# Patient Record
Sex: Male | Born: 1945 | Race: White | Hispanic: No | Marital: Married | Smoking: Former smoker
Health system: Southern US, Community
[De-identification: ages and names within clinical notes are randomized; demographics above are authoritative.]

## PROBLEM LIST (undated history)

## (undated) DIAGNOSIS — E079 Disorder of thyroid, unspecified: Secondary | ICD-10-CM

## (undated) DIAGNOSIS — E119 Type 2 diabetes mellitus without complications: Secondary | ICD-10-CM

## (undated) HISTORY — PX: WRIST SURGERY: SHX841

---

## 2014-11-08 DIAGNOSIS — I10 Essential (primary) hypertension: Secondary | ICD-10-CM | POA: Diagnosis not present

## 2014-11-08 DIAGNOSIS — E785 Hyperlipidemia, unspecified: Secondary | ICD-10-CM | POA: Diagnosis not present

## 2014-11-08 DIAGNOSIS — G4733 Obstructive sleep apnea (adult) (pediatric): Secondary | ICD-10-CM | POA: Diagnosis not present

## 2014-11-08 DIAGNOSIS — E039 Hypothyroidism, unspecified: Secondary | ICD-10-CM | POA: Diagnosis not present

## 2014-12-22 DIAGNOSIS — E119 Type 2 diabetes mellitus without complications: Secondary | ICD-10-CM | POA: Diagnosis not present

## 2014-12-22 DIAGNOSIS — E039 Hypothyroidism, unspecified: Secondary | ICD-10-CM | POA: Diagnosis not present

## 2014-12-28 DIAGNOSIS — E119 Type 2 diabetes mellitus without complications: Secondary | ICD-10-CM | POA: Diagnosis not present

## 2014-12-28 DIAGNOSIS — E039 Hypothyroidism, unspecified: Secondary | ICD-10-CM | POA: Diagnosis not present

## 2014-12-28 DIAGNOSIS — E785 Hyperlipidemia, unspecified: Secondary | ICD-10-CM | POA: Diagnosis not present

## 2014-12-28 DIAGNOSIS — I10 Essential (primary) hypertension: Secondary | ICD-10-CM | POA: Diagnosis not present

## 2015-02-09 DIAGNOSIS — E291 Testicular hypofunction: Secondary | ICD-10-CM | POA: Diagnosis not present

## 2015-02-09 DIAGNOSIS — E559 Vitamin D deficiency, unspecified: Secondary | ICD-10-CM | POA: Diagnosis not present

## 2015-02-09 DIAGNOSIS — I10 Essential (primary) hypertension: Secondary | ICD-10-CM | POA: Diagnosis not present

## 2015-02-09 DIAGNOSIS — F325 Major depressive disorder, single episode, in full remission: Secondary | ICD-10-CM | POA: Diagnosis not present

## 2015-02-09 DIAGNOSIS — N529 Male erectile dysfunction, unspecified: Secondary | ICD-10-CM | POA: Diagnosis not present

## 2015-02-09 DIAGNOSIS — Z720 Tobacco use: Secondary | ICD-10-CM | POA: Diagnosis not present

## 2015-02-09 DIAGNOSIS — M79601 Pain in right arm: Secondary | ICD-10-CM | POA: Diagnosis not present

## 2015-02-09 DIAGNOSIS — E039 Hypothyroidism, unspecified: Secondary | ICD-10-CM | POA: Diagnosis not present

## 2015-02-09 DIAGNOSIS — K219 Gastro-esophageal reflux disease without esophagitis: Secondary | ICD-10-CM | POA: Diagnosis not present

## 2015-02-09 DIAGNOSIS — G4733 Obstructive sleep apnea (adult) (pediatric): Secondary | ICD-10-CM | POA: Diagnosis not present

## 2015-02-09 DIAGNOSIS — E119 Type 2 diabetes mellitus without complications: Secondary | ICD-10-CM | POA: Diagnosis not present

## 2015-02-22 DIAGNOSIS — E119 Type 2 diabetes mellitus without complications: Secondary | ICD-10-CM | POA: Diagnosis not present

## 2015-02-22 DIAGNOSIS — E785 Hyperlipidemia, unspecified: Secondary | ICD-10-CM | POA: Diagnosis not present

## 2015-02-22 DIAGNOSIS — E559 Vitamin D deficiency, unspecified: Secondary | ICD-10-CM | POA: Diagnosis not present

## 2015-02-22 DIAGNOSIS — E039 Hypothyroidism, unspecified: Secondary | ICD-10-CM | POA: Diagnosis not present

## 2015-05-17 DIAGNOSIS — I10 Essential (primary) hypertension: Secondary | ICD-10-CM | POA: Diagnosis not present

## 2015-05-17 DIAGNOSIS — F325 Major depressive disorder, single episode, in full remission: Secondary | ICD-10-CM | POA: Diagnosis not present

## 2015-05-17 DIAGNOSIS — E785 Hyperlipidemia, unspecified: Secondary | ICD-10-CM | POA: Diagnosis not present

## 2015-05-17 DIAGNOSIS — G4733 Obstructive sleep apnea (adult) (pediatric): Secondary | ICD-10-CM | POA: Diagnosis not present

## 2015-05-17 DIAGNOSIS — Z23 Encounter for immunization: Secondary | ICD-10-CM | POA: Diagnosis not present

## 2015-05-17 DIAGNOSIS — E119 Type 2 diabetes mellitus without complications: Secondary | ICD-10-CM | POA: Diagnosis not present

## 2015-08-22 DIAGNOSIS — E119 Type 2 diabetes mellitus without complications: Secondary | ICD-10-CM | POA: Diagnosis not present

## 2015-08-23 DIAGNOSIS — M7582 Other shoulder lesions, left shoulder: Secondary | ICD-10-CM | POA: Diagnosis not present

## 2015-08-23 DIAGNOSIS — S30861D Insect bite (nonvenomous) of abdominal wall, subsequent encounter: Secondary | ICD-10-CM | POA: Diagnosis not present

## 2015-08-23 DIAGNOSIS — E119 Type 2 diabetes mellitus without complications: Secondary | ICD-10-CM | POA: Diagnosis not present

## 2015-08-23 DIAGNOSIS — Z23 Encounter for immunization: Secondary | ICD-10-CM | POA: Diagnosis not present

## 2015-11-30 DIAGNOSIS — E119 Type 2 diabetes mellitus without complications: Secondary | ICD-10-CM | POA: Diagnosis not present

## 2015-11-30 DIAGNOSIS — Z8249 Family history of ischemic heart disease and other diseases of the circulatory system: Secondary | ICD-10-CM | POA: Diagnosis not present

## 2015-11-30 DIAGNOSIS — E782 Mixed hyperlipidemia: Secondary | ICD-10-CM | POA: Diagnosis not present

## 2015-11-30 DIAGNOSIS — G4733 Obstructive sleep apnea (adult) (pediatric): Secondary | ICD-10-CM | POA: Diagnosis not present

## 2015-11-30 DIAGNOSIS — K219 Gastro-esophageal reflux disease without esophagitis: Secondary | ICD-10-CM | POA: Diagnosis not present

## 2015-11-30 DIAGNOSIS — N529 Male erectile dysfunction, unspecified: Secondary | ICD-10-CM | POA: Diagnosis not present

## 2015-11-30 DIAGNOSIS — E039 Hypothyroidism, unspecified: Secondary | ICD-10-CM | POA: Diagnosis not present

## 2015-12-28 DIAGNOSIS — E119 Type 2 diabetes mellitus without complications: Secondary | ICD-10-CM | POA: Diagnosis not present

## 2016-02-27 DIAGNOSIS — E119 Type 2 diabetes mellitus without complications: Secondary | ICD-10-CM | POA: Diagnosis not present

## 2016-02-27 DIAGNOSIS — K219 Gastro-esophageal reflux disease without esophagitis: Secondary | ICD-10-CM | POA: Diagnosis not present

## 2016-02-27 DIAGNOSIS — E559 Vitamin D deficiency, unspecified: Secondary | ICD-10-CM | POA: Diagnosis not present

## 2016-02-27 DIAGNOSIS — G4733 Obstructive sleep apnea (adult) (pediatric): Secondary | ICD-10-CM | POA: Diagnosis not present

## 2016-02-27 DIAGNOSIS — E782 Mixed hyperlipidemia: Secondary | ICD-10-CM | POA: Diagnosis not present

## 2016-02-27 DIAGNOSIS — N529 Male erectile dysfunction, unspecified: Secondary | ICD-10-CM | POA: Diagnosis not present

## 2016-02-27 DIAGNOSIS — E039 Hypothyroidism, unspecified: Secondary | ICD-10-CM | POA: Diagnosis not present

## 2016-03-01 DIAGNOSIS — E119 Type 2 diabetes mellitus without complications: Secondary | ICD-10-CM | POA: Diagnosis not present

## 2016-03-01 DIAGNOSIS — E559 Vitamin D deficiency, unspecified: Secondary | ICD-10-CM | POA: Diagnosis not present

## 2016-03-01 DIAGNOSIS — E782 Mixed hyperlipidemia: Secondary | ICD-10-CM | POA: Diagnosis not present

## 2016-03-01 DIAGNOSIS — G4733 Obstructive sleep apnea (adult) (pediatric): Secondary | ICD-10-CM | POA: Diagnosis not present

## 2016-03-01 DIAGNOSIS — K219 Gastro-esophageal reflux disease without esophagitis: Secondary | ICD-10-CM | POA: Diagnosis not present

## 2016-03-01 DIAGNOSIS — E039 Hypothyroidism, unspecified: Secondary | ICD-10-CM | POA: Diagnosis not present

## 2016-08-26 DIAGNOSIS — E119 Type 2 diabetes mellitus without complications: Secondary | ICD-10-CM | POA: Diagnosis not present

## 2016-08-26 DIAGNOSIS — E039 Hypothyroidism, unspecified: Secondary | ICD-10-CM | POA: Diagnosis not present

## 2016-08-26 DIAGNOSIS — K219 Gastro-esophageal reflux disease without esophagitis: Secondary | ICD-10-CM | POA: Diagnosis not present

## 2016-08-26 DIAGNOSIS — E782 Mixed hyperlipidemia: Secondary | ICD-10-CM | POA: Diagnosis not present

## 2016-08-26 DIAGNOSIS — E559 Vitamin D deficiency, unspecified: Secondary | ICD-10-CM | POA: Diagnosis not present

## 2016-08-29 DIAGNOSIS — E119 Type 2 diabetes mellitus without complications: Secondary | ICD-10-CM | POA: Diagnosis not present

## 2016-08-29 DIAGNOSIS — Z6829 Body mass index (BMI) 29.0-29.9, adult: Secondary | ICD-10-CM | POA: Diagnosis not present

## 2016-08-29 DIAGNOSIS — E039 Hypothyroidism, unspecified: Secondary | ICD-10-CM | POA: Diagnosis not present

## 2016-08-29 DIAGNOSIS — Z8249 Family history of ischemic heart disease and other diseases of the circulatory system: Secondary | ICD-10-CM | POA: Diagnosis not present

## 2016-08-29 DIAGNOSIS — Z23 Encounter for immunization: Secondary | ICD-10-CM | POA: Diagnosis not present

## 2016-08-29 DIAGNOSIS — E782 Mixed hyperlipidemia: Secondary | ICD-10-CM | POA: Diagnosis not present

## 2016-08-29 DIAGNOSIS — K219 Gastro-esophageal reflux disease without esophagitis: Secondary | ICD-10-CM | POA: Diagnosis not present

## 2017-03-06 DIAGNOSIS — E782 Mixed hyperlipidemia: Secondary | ICD-10-CM | POA: Diagnosis not present

## 2017-03-06 DIAGNOSIS — E119 Type 2 diabetes mellitus without complications: Secondary | ICD-10-CM | POA: Diagnosis not present

## 2017-03-06 DIAGNOSIS — E039 Hypothyroidism, unspecified: Secondary | ICD-10-CM | POA: Diagnosis not present

## 2017-03-06 DIAGNOSIS — K219 Gastro-esophageal reflux disease without esophagitis: Secondary | ICD-10-CM | POA: Diagnosis not present

## 2017-03-11 DIAGNOSIS — E119 Type 2 diabetes mellitus without complications: Secondary | ICD-10-CM | POA: Diagnosis not present

## 2017-03-11 DIAGNOSIS — E782 Mixed hyperlipidemia: Secondary | ICD-10-CM | POA: Diagnosis not present

## 2017-03-11 DIAGNOSIS — Z1389 Encounter for screening for other disorder: Secondary | ICD-10-CM | POA: Diagnosis not present

## 2017-03-11 DIAGNOSIS — Z6828 Body mass index (BMI) 28.0-28.9, adult: Secondary | ICD-10-CM | POA: Diagnosis not present

## 2017-03-11 DIAGNOSIS — K219 Gastro-esophageal reflux disease without esophagitis: Secondary | ICD-10-CM | POA: Diagnosis not present

## 2017-03-11 DIAGNOSIS — E039 Hypothyroidism, unspecified: Secondary | ICD-10-CM | POA: Diagnosis not present

## 2017-03-11 DIAGNOSIS — Z0001 Encounter for general adult medical examination with abnormal findings: Secondary | ICD-10-CM | POA: Diagnosis not present

## 2017-03-11 DIAGNOSIS — N529 Male erectile dysfunction, unspecified: Secondary | ICD-10-CM | POA: Diagnosis not present

## 2018-01-02 ENCOUNTER — Emergency Department (HOSPITAL_COMMUNITY): Payer: Medicare Other

## 2018-01-02 ENCOUNTER — Other Ambulatory Visit: Payer: Self-pay

## 2018-01-02 ENCOUNTER — Encounter (HOSPITAL_COMMUNITY): Payer: Self-pay | Admitting: Emergency Medicine

## 2018-01-02 ENCOUNTER — Emergency Department (HOSPITAL_COMMUNITY)
Admission: EM | Admit: 2018-01-02 | Discharge: 2018-01-02 | Disposition: A | Discharge status: Home / Self Care | Payer: Medicare Other | Attending: Emergency Medicine | Admitting: Emergency Medicine

## 2018-01-02 DIAGNOSIS — M542 Cervicalgia: Secondary | ICD-10-CM | POA: Insufficient documentation

## 2018-01-02 DIAGNOSIS — Z7982 Long term (current) use of aspirin: Secondary | ICD-10-CM | POA: Diagnosis not present

## 2018-01-02 DIAGNOSIS — R079 Chest pain, unspecified: Secondary | ICD-10-CM | POA: Diagnosis not present

## 2018-01-02 DIAGNOSIS — E119 Type 2 diabetes mellitus without complications: Secondary | ICD-10-CM | POA: Diagnosis not present

## 2018-01-02 DIAGNOSIS — Z79899 Other long term (current) drug therapy: Secondary | ICD-10-CM | POA: Diagnosis not present

## 2018-01-02 DIAGNOSIS — Z87891 Personal history of nicotine dependence: Secondary | ICD-10-CM | POA: Diagnosis not present

## 2018-01-02 DIAGNOSIS — R202 Paresthesia of skin: Secondary | ICD-10-CM | POA: Diagnosis not present

## 2018-01-02 DIAGNOSIS — Z7984 Long term (current) use of oral hypoglycemic drugs: Secondary | ICD-10-CM | POA: Diagnosis not present

## 2018-01-02 DIAGNOSIS — R51 Headache: Secondary | ICD-10-CM | POA: Diagnosis not present

## 2018-01-02 HISTORY — DX: Disorder of thyroid, unspecified: E07.9

## 2018-01-02 HISTORY — DX: Type 2 diabetes mellitus without complications: E11.9

## 2018-01-02 LAB — CBC WITH DIFFERENTIAL/PLATELET
BASOS ABS: 0 10*3/uL (ref 0.0–0.1)
BASOS PCT: 0 %
EOS PCT: 2 %
Eosinophils Absolute: 0.2 10*3/uL (ref 0.0–0.7)
HCT: 41.8 % (ref 39.0–52.0)
Hemoglobin: 14.1 g/dL (ref 13.0–17.0)
Lymphocytes Relative: 37 %
Lymphs Abs: 3.1 10*3/uL (ref 0.7–4.0)
MCH: 31.4 pg (ref 26.0–34.0)
MCHC: 33.7 g/dL (ref 30.0–36.0)
MCV: 93.1 fL (ref 78.0–100.0)
MONO ABS: 0.7 10*3/uL (ref 0.1–1.0)
Monocytes Relative: 9 %
Neutro Abs: 4.3 10*3/uL (ref 1.7–7.7)
Neutrophils Relative %: 52 %
PLATELETS: 208 10*3/uL (ref 150–400)
RBC: 4.49 MIL/uL (ref 4.22–5.81)
RDW: 12.6 % (ref 11.5–15.5)
WBC: 8.3 10*3/uL (ref 4.0–10.5)

## 2018-01-02 LAB — BASIC METABOLIC PANEL
ANION GAP: 12 (ref 5–15)
BUN: 14 mg/dL (ref 6–20)
CALCIUM: 9.3 mg/dL (ref 8.9–10.3)
CO2: 22 mmol/L (ref 22–32)
Chloride: 103 mmol/L (ref 101–111)
Creatinine, Ser: 0.94 mg/dL (ref 0.61–1.24)
GFR calc Af Amer: >60 mL/min (ref >60)
GFR calc non Af Amer: >60 mL/min (ref >60)
GLUCOSE: 121 mg/dL — AB (ref 65–99)
Potassium: 3.9 mmol/L (ref 3.5–5.1)
Sodium: 137 mmol/L (ref 135–145)

## 2018-01-02 MED ORDER — IOPAMIDOL (ISOVUE-370) INJECTION 76%
80.0000 mL | Freq: Once | INTRAVENOUS | Status: AC | PRN
  Administered 2018-01-02: 80 mL via INTRAVENOUS

## 2018-01-02 NOTE — Discharge Instructions (Addendum)
CT angios of the neck without any acute vessel problems.  But does show some degenerative changes in the cervical spine would recommend outpatient MRI there may be a pinched nerve referral made to neurology call for appointment.  Return for new or worse symptoms.

## 2018-01-02 NOTE — ED Provider Notes (Signed)
CT angios without any acute findings.  Does raise some concerns for some cervical degenerative changes and possible herniated disc.  Would recommend outpatient MRI patient will follow-up either with his provider and has been given a referral to neurology as well.   Vanetta MuldersZackowski, Gerardine Peltz, MD 01/02/18 [id number]

## 2018-01-02 NOTE — ED Provider Notes (Signed)
[facility] EMERGENCY DEPARTMENT Provider Note   CSN: [id number] Arrival date & time: 01/02/18  1109     History   Chief Complaint Chief Complaint  Patient presents with  . Neck Pain    HPI Rick DuffJames Alicia is a 72 y.o. male.  Patient developed left-sided posterolateral neck pain 1.5 weeks ago after he was working out at [employer].  He reports that 4-5 days ago pain moved over to the right posterolateral neck region accompanied by a tingling sensation in his occiput.  He states, "I think I might have a circulatory problem".  He denies any visual changes pain is worse when he rotates his neck improved when he remains still.  Has difficulty with speech or with gait.  He states "sometimes I feel off balance when I bend forward for the 1.5 months."  No other associated symptoms no treatment prior to coming here  HPI  Past Medical History:  Diagnosis Date  . Diabetes mellitus without complication (HCC)   . Thyroid disease     There are no active problems to display for this patient.   Past Surgical History:  Procedure Laterality Date  . WRIST SURGERY Right         Home Medications    Prior to Admission medications   Medication Sig Start Date End Date Taking? Authorizing Provider  aspirin EC 81 MG tablet Take 81 mg by mouth daily.   Yes [provider]  atorvastatin (LIPITOR) 20 MG tablet Take 1 tablet by mouth daily. 11/18/17  Yes [provider]  JANUVIA 100 MG tablet Take 1 tablet by mouth daily. 11/22/17  Yes [provider]  lisinopril (PRINIVIL,ZESTRIL) 5 MG tablet Take 1 tablet by mouth daily. 12/01/17  Yes [provider]  metFORMIN (GLUCOPHAGE) 1000 MG tablet Take 500 mg by mouth 2 (two) times daily.  11/22/17  Yes [provider]  omeprazole (PRILOSEC) 20 MG capsule Take 1 capsule by mouth daily. 10/30/17  Yes [provider]  SYNTHROID 150 MCG tablet Take 1 tablet by mouth daily. 12/14/17  Yes [provider]    VIAGRA 50 MG tablet Take 1 tablet by mouth as needed. 11/26/17  Yes [provider]    Family History Family History  Problem Relation Age of Onset  . Heart failure Mother   . Heart failure Other     Social History Social History   Tobacco Use  . Smoking status: Former Smoker    Packs/day: 0.50    Years: 15.00    Pack years: 7.50    Types: Cigarettes    Last attempt to quit: 12/12/2013    Years since quitting: 4.0  . Smokeless tobacco: Never Used  Substance Use Topics  . Alcohol use: Not Currently  . Drug use: Never     Allergies   Patient has no known allergies.   Review of Systems Review of Systems  Constitutional: Negative.   HENT: Negative.   Respiratory: Negative.   Cardiovascular: Positive for chest pain.       Syncope  Gastrointestinal: Negative.   Musculoskeletal: Positive for neck pain.  Skin: Negative.   Allergic/Immunologic: Positive for immunocompromised state.  Neurological: Negative.   Psychiatric/Behavioral: Negative.   All other systems reviewed and are negative.   Physical Exam Updated Vital Signs BP (!) 148/86   Pulse 75   Temp 98.2 F (36.8 C) (Oral)   Resp 14   Ht 6\' 1"  (1.854 m)   Wt 94.3 kg (208 lb)  SpO2 100%   BMI 27.44 kg/m   Physical Exam  Constitutional: He is oriented to person, place, and time. He appears well-developed and well-nourished. No distress.  HENT:  Head: Normocephalic and atraumatic.  Eyes: Pupils are equal, round, and reactive to light. Conjunctivae are normal.  Neck: Neck supple. No tracheal deviation present. No thyromegaly present.  No bruit  Cardiovascular: Normal rate and regular rhythm.  No murmur heard. Pulmonary/Chest: Effort normal and breath sounds normal.  Abdominal: Soft. Bowel sounds are normal. He exhibits no distension. There is no tenderness.  Musculoskeletal: Normal range of motion. He exhibits no edema or tenderness.  Neurological: He is alert and oriented to person, place,  and time. Coordination normal.  Gait normal Romberg normal pronator drift normal finger to nose normal cranial nerves II through XII grossly intact  Skin: Skin is warm and dry. No rash noted.  Psychiatric: He has a normal mood and affect.  Nursing note and vitals reviewed.    ED Treatments / Results  Labs (all labs ordered are listed, but only abnormal results are displayed) Labs Reviewed  BASIC METABOLIC PANEL  CBC WITH DIFFERENTIAL/PLATELET    EKG None  Radiology No results found.  Procedures Procedures (including critical care time)  Medications Ordered in ED Medications - No data to display Results for orders placed or performed during the hospital encounter of 01/02/18  Basic metabolic panel  Result Value Ref Range   Sodium 137 135 - 145 mmol/L   Potassium 3.9 3.5 - 5.1 mmol/L   Chloride 103 101 - 111 mmol/L   CO2 22 22 - 32 mmol/L   Glucose, Bld 121 (H) 65 - 99 mg/dL   BUN 14 6 - 20 mg/dL   Creatinine, Ser 1.61 0.61 - 1.24 mg/dL   Calcium 9.3 8.9 - 09.6 mg/dL   GFR calc non Af Amer >60 >60 mL/min   GFR calc Af Amer >60 >60 mL/min   Anion gap 12 5 - 15  CBC with Differential/Platelet  Result Value Ref Range   WBC 8.3 4.0 - 10.5 K/uL   RBC 4.49 4.22 - 5.81 MIL/uL   Hemoglobin 14.1 13.0 - 17.0 g/dL   HCT 04.5 40.9 - 81.1 %   MCV 93.1 78.0 - 100.0 fL   MCH 31.4 26.0 - 34.0 pg   MCHC 33.7 30.0 - 36.0 g/dL   RDW 91.4 78.2 - 95.6 %   Platelets 208 150 - 400 K/uL   Neutrophils Relative % 52 %   Neutro Abs 4.3 1.7 - 7.7 K/uL   Lymphocytes Relative 37 %   Lymphs Abs 3.1 0.7 - 4.0 K/uL   Monocytes Relative 9 %   Monocytes Absolute 0.7 0.1 - 1.0 K/uL   Eosinophils Relative 2 %   Eosinophils Absolute 0.2 0.0 - 0.7 K/uL   Basophils Relative 0 %   Basophils Absolute 0.0 0.0 - 0.1 K/uL   No results found.  Initial Impression / Assessment and Plan / ED Course  I have reviewed the triage vital signs and the nursing notes.  Pertinent labs & imaging results that  were available during my care of the patient were reviewed by me and considered in my medical decision making (see chart for details).     Patient signed out to [facility] 4:30 PM  Final Clinical Impressions(s) / ED Diagnoses  Dx #1 neck pain #2 paresthesias Final diagnoses:  None    ED Discharge Orders    None       Jacobi Ryant,  Benigno Check, MD 01/02/18 [phone]

## 2018-01-02 NOTE — ED Triage Notes (Signed)
Patient c/o neck pain that radiates into back of head. Per patient started on left side a week ago after working out at the gym-per patient thought it was a strained muscle. Pain radiated onto right side in which he used Tiger balm. Patient reports pain and tingling getting progressively worse and now radiates up into right side of head and states off balance. Denies any weakness or slurred speech.

## 2018-01-16 DIAGNOSIS — M4802 Spinal stenosis, cervical region: Secondary | ICD-10-CM | POA: Diagnosis not present

## 2018-01-16 DIAGNOSIS — M47812 Spondylosis without myelopathy or radiculopathy, cervical region: Secondary | ICD-10-CM | POA: Diagnosis not present

## 2018-01-21 DIAGNOSIS — E039 Hypothyroidism, unspecified: Secondary | ICD-10-CM | POA: Diagnosis not present

## 2018-01-21 DIAGNOSIS — Z6828 Body mass index (BMI) 28.0-28.9, adult: Secondary | ICD-10-CM | POA: Diagnosis not present

## 2018-01-21 DIAGNOSIS — M4802 Spinal stenosis, cervical region: Secondary | ICD-10-CM | POA: Diagnosis not present

## 2018-01-21 DIAGNOSIS — E119 Type 2 diabetes mellitus without complications: Secondary | ICD-10-CM | POA: Diagnosis not present

## 2018-01-21 DIAGNOSIS — M47812 Spondylosis without myelopathy or radiculopathy, cervical region: Secondary | ICD-10-CM | POA: Diagnosis not present

## 2018-01-21 DIAGNOSIS — E782 Mixed hyperlipidemia: Secondary | ICD-10-CM | POA: Diagnosis not present

## 2018-04-20 DIAGNOSIS — E119 Type 2 diabetes mellitus without complications: Secondary | ICD-10-CM | POA: Diagnosis not present

## 2018-04-20 DIAGNOSIS — K219 Gastro-esophageal reflux disease without esophagitis: Secondary | ICD-10-CM | POA: Diagnosis not present

## 2018-04-20 DIAGNOSIS — G4733 Obstructive sleep apnea (adult) (pediatric): Secondary | ICD-10-CM | POA: Diagnosis not present

## 2018-04-20 DIAGNOSIS — E039 Hypothyroidism, unspecified: Secondary | ICD-10-CM | POA: Diagnosis not present

## 2018-04-20 DIAGNOSIS — E782 Mixed hyperlipidemia: Secondary | ICD-10-CM | POA: Diagnosis not present

## 2018-04-24 DIAGNOSIS — G4733 Obstructive sleep apnea (adult) (pediatric): Secondary | ICD-10-CM | POA: Diagnosis not present

## 2018-04-24 DIAGNOSIS — M4802 Spinal stenosis, cervical region: Secondary | ICD-10-CM | POA: Diagnosis not present

## 2018-04-24 DIAGNOSIS — K219 Gastro-esophageal reflux disease without esophagitis: Secondary | ICD-10-CM | POA: Diagnosis not present

## 2018-04-24 DIAGNOSIS — Z6827 Body mass index (BMI) 27.0-27.9, adult: Secondary | ICD-10-CM | POA: Diagnosis not present

## 2018-04-24 DIAGNOSIS — Z0001 Encounter for general adult medical examination with abnormal findings: Secondary | ICD-10-CM | POA: Diagnosis not present

## 2018-04-24 DIAGNOSIS — M47812 Spondylosis without myelopathy or radiculopathy, cervical region: Secondary | ICD-10-CM | POA: Diagnosis not present

## 2018-04-24 DIAGNOSIS — E039 Hypothyroidism, unspecified: Secondary | ICD-10-CM | POA: Diagnosis not present

## 2018-04-24 DIAGNOSIS — E782 Mixed hyperlipidemia: Secondary | ICD-10-CM | POA: Diagnosis not present

## 2018-04-24 DIAGNOSIS — E1165 Type 2 diabetes mellitus with hyperglycemia: Secondary | ICD-10-CM | POA: Diagnosis not present

## 2018-05-04 DIAGNOSIS — Z1211 Encounter for screening for malignant neoplasm of colon: Secondary | ICD-10-CM | POA: Diagnosis not present

## 2018-05-04 DIAGNOSIS — Z1212 Encounter for screening for malignant neoplasm of rectum: Secondary | ICD-10-CM | POA: Diagnosis not present

## 2018-08-12 DIAGNOSIS — E039 Hypothyroidism, unspecified: Secondary | ICD-10-CM | POA: Diagnosis not present

## 2018-08-12 DIAGNOSIS — E1165 Type 2 diabetes mellitus with hyperglycemia: Secondary | ICD-10-CM | POA: Diagnosis not present

## 2018-09-11 DIAGNOSIS — E782 Mixed hyperlipidemia: Secondary | ICD-10-CM | POA: Diagnosis not present

## 2018-09-11 DIAGNOSIS — E039 Hypothyroidism, unspecified: Secondary | ICD-10-CM | POA: Diagnosis not present

## 2018-09-11 DIAGNOSIS — E119 Type 2 diabetes mellitus without complications: Secondary | ICD-10-CM | POA: Diagnosis not present

## 2018-09-11 DIAGNOSIS — N529 Male erectile dysfunction, unspecified: Secondary | ICD-10-CM | POA: Diagnosis not present

## 2018-09-11 DIAGNOSIS — K219 Gastro-esophageal reflux disease without esophagitis: Secondary | ICD-10-CM | POA: Diagnosis not present

## 2018-09-11 DIAGNOSIS — E663 Overweight: Secondary | ICD-10-CM | POA: Diagnosis not present

## 2018-09-11 DIAGNOSIS — Z794 Long term (current) use of insulin: Secondary | ICD-10-CM | POA: Diagnosis not present

## 2018-12-07 DIAGNOSIS — E782 Mixed hyperlipidemia: Secondary | ICD-10-CM | POA: Diagnosis not present

## 2018-12-07 DIAGNOSIS — E119 Type 2 diabetes mellitus without complications: Secondary | ICD-10-CM | POA: Diagnosis not present

## 2018-12-07 DIAGNOSIS — Z794 Long term (current) use of insulin: Secondary | ICD-10-CM | POA: Diagnosis not present

## 2018-12-07 DIAGNOSIS — E039 Hypothyroidism, unspecified: Secondary | ICD-10-CM | POA: Diagnosis not present

## 2018-12-16 DIAGNOSIS — Z794 Long term (current) use of insulin: Secondary | ICD-10-CM | POA: Diagnosis not present

## 2018-12-16 DIAGNOSIS — N529 Male erectile dysfunction, unspecified: Secondary | ICD-10-CM | POA: Diagnosis not present

## 2018-12-16 DIAGNOSIS — K219 Gastro-esophageal reflux disease without esophagitis: Secondary | ICD-10-CM | POA: Diagnosis not present

## 2018-12-16 DIAGNOSIS — E663 Overweight: Secondary | ICD-10-CM | POA: Diagnosis not present

## 2018-12-16 DIAGNOSIS — E039 Hypothyroidism, unspecified: Secondary | ICD-10-CM | POA: Diagnosis not present

## 2018-12-16 DIAGNOSIS — E782 Mixed hyperlipidemia: Secondary | ICD-10-CM | POA: Diagnosis not present

## 2018-12-16 DIAGNOSIS — E119 Type 2 diabetes mellitus without complications: Secondary | ICD-10-CM | POA: Diagnosis not present

## 2020-02-24 IMAGING — CT CT ANGIO NECK
1 of 11 series · 5 of 33 positions shown · IV contrast (Isovue)
Comparison: None.

CLINICAL DATA: 72-year-old male with neck pain radiating to the
back of the head beginning on the left side week ago after
exercising at the gym. Subsequently pain is also spread to the right
side. The patient feels off-balance.

EXAM:
CT ANGIOGRAPHY HEAD AND NECK
TECHNIQUE: Multidetector CT imaging of the head and neck was performed using
the standard protocol during bolus administration of intravenous
contrast. Multiplanar CT image reconstructions and MIPs were
obtained to evaluate the vascular anatomy. Carotid stenosis
measurements (when applicable) are obtained utilizing NASCET
criteria, using the distal internal carotid diameter as the
denominator.
CONTRAST:  80mL GPTVYR-QDH IOPAMIDOL (GPTVYR-QDH) INJECTION 76%

[Series 10: ax thin · axial · 0.39mm/px · z∈[-140,+112]mm · 5 of 379 slices shown]
[im 64/379  soft-tissue]
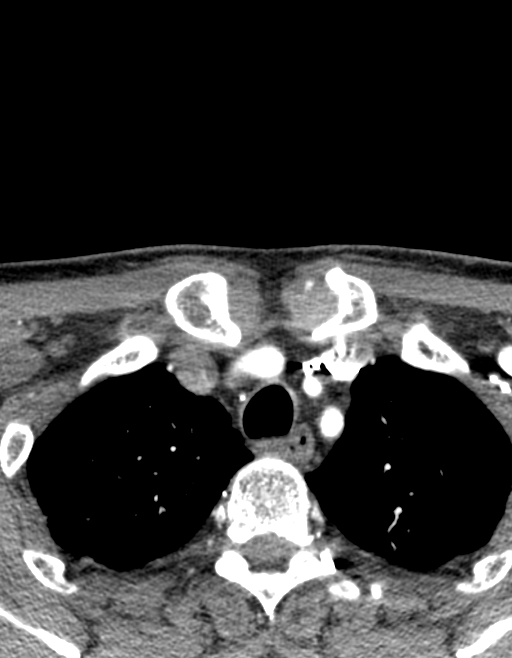
[im 127/379  bone]
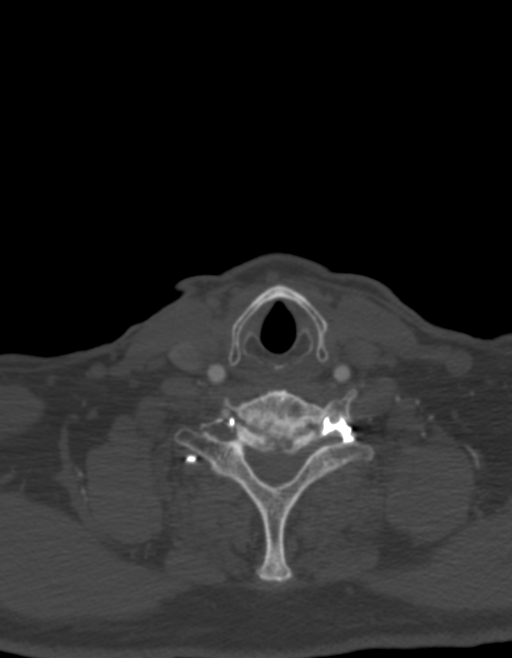
[im 190/379  soft-tissue]
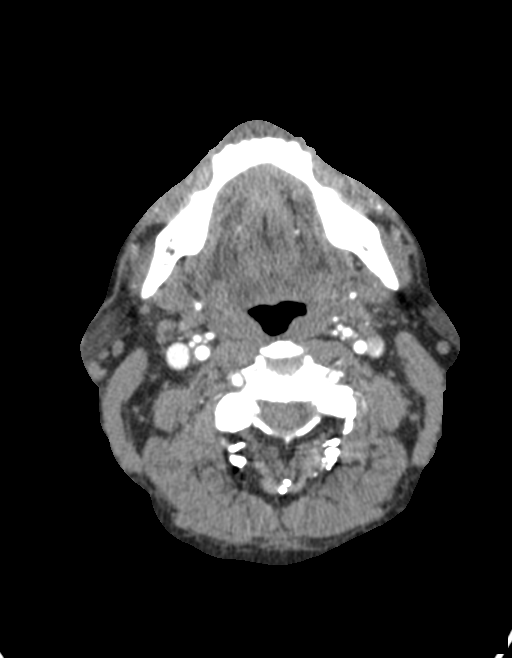
[im 253/379  bone]
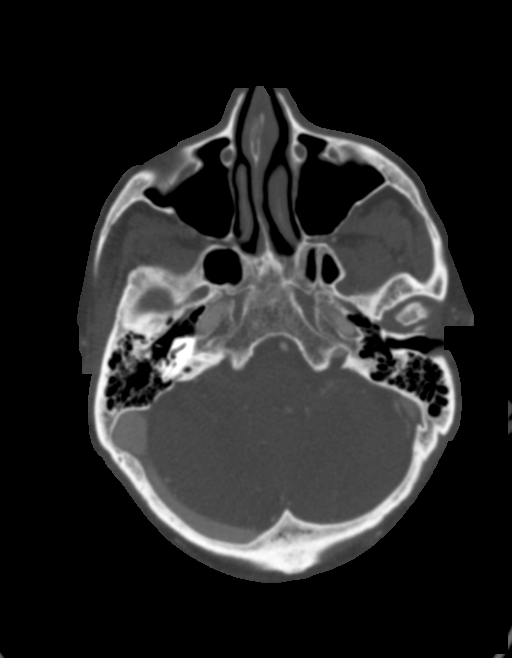
[im 316/379  soft-tissue]
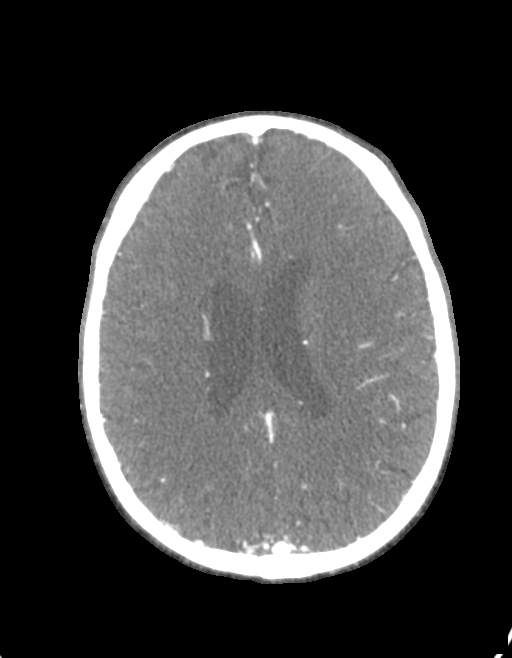

[5 of 33 positions shown; findings below may reference images not displayed]

FINDINGS: CT HEAD

Brain: Cerebral volume is within normal limits for age. No midline
shift, ventriculomegaly, mass effect, evidence of mass lesion,
intracranial hemorrhage or evidence of cortically based acute
infarction. Gray-white matter differentiation is within normal
limits throughout the brain.

Calvarium and skull base: Intact, negative.

Paranasal sinuses: Well pneumatized bilaterally.

Orbits: Visualized orbits and scalp soft tissues are within normal
limits.

CTA NECK

Skeleton: No acute osseous abnormality identified. Right TMJ
degeneration. Intermittent degenerative endplate sclerosis in the
cervical spine. The cervical spine is remarkable for the following

C2-C3: Moderate facet hypertrophy greater on the right. Endplate
spurring. Mild to moderate bilateral C3 foraminal stenosis.

C3-C4: Moderate bilateral facet hypertrophy greater on the left.
Left foraminal endplate spurring. Severe left C4 foraminal stenosis.

C4-C5: Moderate to severe facet hypertrophy on the right with
subchondral cysts. Mild left facet hypertrophy. Right greater than
left endplate spurring. Disc bulge suspected. Borderline to mild
spinal stenosis. Moderate left and severe right C5 foraminal
stenosis.

C5-C6: Disc space loss. Circumferential disc bulge and endplate
spurring. Bilateral foraminal involvement. Borderline to mild spinal
stenosis. Severe bilateral C6 foraminal stenosis.

C6-C7: Disc space loss with circumferential disc osteophyte complex.
Borderline to mild spinal stenosis. Severe left and moderate right
C7 foraminal stenosis.

C7-T1: Mild facet hypertrophy greater on the right. Borderline to
mild bilateral C8 foraminal stenosis.

No CT evidence of upper thoracic spinal stenosis.

Upper chest: Mildly increased interstitial markings throughout the
upper lungs. Mild dependent atelectasis. No superior mediastinal
lymphadenopathy.

Other neck: Negative neck soft tissues. No cervical lymphadenopathy.

Aortic arch: 3 vessel arch configuration. Minimal arch
atherosclerosis.

Right carotid system: No brachiocephalic or right CCA origin
stenosis, mild if any plaque. Mild soft plaque at the right ICA
origin and bulb. Patent right carotid bifurcation with no cervical
right ICA stenosis.

Left carotid system: Negative left CCA. Mild soft and calcified
plaque at the lateral left ICA origin. No stenosis. Negative
cervical left ICA otherwise.

Vertebral arteries:
No proximal right subclavian artery stenosis despite mild soft and
calcified plaque. Normal right vertebral artery origin the right
vertebral artery is patent and within normal limits to the skull
base.

No proximal right subclavian artery stenosis, mild if any plaque.
The left vertebral artery origin is within normal limits. The
proximal left V1 segment is obscured by some paravertebral venous
contrast reflux from left subclavian/upper extremity contrast
injection. The visible left V2 segment is normal. The left vertebral
artery is patent and within normal limits to the skull base.

CTA HEAD

Posterior circulation: The bilateral PICA origins are normal. Both
vertebral arteries are diminutive distal to the PICA. No distal
vertebral stenosis. Patent vertebrobasilar junction. Patent somewhat
diminutive basilar artery without stenosis. Normal SCA origins. Left
PCA origin is normal. There is a fetal type right PCA origin. The
left posterior communicating artery Posterior communicating artery
is diminutive or absent. Bilateral PCA branches are within normal
limits.

Anterior circulation: Both ICA siphons are patent. There is mild
siphon calcified plaque greater on the right. No siphon stenosis.
Normal ophthalmic and right posterior communicating artery origins.
Patent carotid termini. Normal MCA and ACA origins. Anterior
communicating artery and bilateral ACA branches are within normal
limits, the left A1 and A2 are mildly dominant. The left MCA M1
segment, left MCA bifurcation and left MCA branches are within
normal limits. The right MCA M1 segment, right MCA trifurcation, and
right MCA branches are within normal limits.

Venous sinuses: Patent.

Anatomic variants: Fetal type right PCA origin with associated
diminutive distal vertebral arteries and basilar. Mildly dominant
left ACA.

Delayed phase: No abnormal enhancement identified.

Review of the MIP images confirms the above findings
IMPRESSION: 1. Negative arterial findings on CTA;
- no arterial dissection in the neck.
- atherosclerosis, but no significant arterial stenosis in the head
or neck.
- no intracranial aneurysm.
2.  Normal for age CT appearance of the brain.
3. Widespread cervical spine degeneration, although bilateral
cervical neural foraminal stenosis is the dominant finding. There is
up to mild lower cervical spinal stenosis C4-C5 through C6-C7. But
there is no bulky cervical disc herniation evident.
